# Patient Record
Sex: Female | Born: 1970 | Race: White | Hispanic: No | Marital: Married
Health system: Southern US, Community
[De-identification: ages and names within clinical notes are randomized; demographics above are authoritative.]

---

## 2006-08-27 ENCOUNTER — Ambulatory Visit: Payer: Self-pay | Admitting: Emergency Medicine

## 2007-01-10 ENCOUNTER — Ambulatory Visit: Payer: Self-pay | Admitting: Specialist

## 2007-08-08 ENCOUNTER — Encounter: Payer: Self-pay | Admitting: Maternal & Fetal Medicine

## 2007-09-05 ENCOUNTER — Encounter: Payer: Self-pay | Admitting: Obstetrics and Gynecology

## 2007-11-01 ENCOUNTER — Observation Stay: Payer: Self-pay | Admitting: Obstetrics & Gynecology

## 2007-12-09 ENCOUNTER — Ambulatory Visit: Payer: Self-pay

## 2007-12-10 ENCOUNTER — Observation Stay: Payer: Self-pay | Admitting: Obstetrics and Gynecology

## 2007-12-30 ENCOUNTER — Encounter: Payer: Self-pay | Admitting: Maternal and Fetal Medicine

## 2008-01-07 ENCOUNTER — Observation Stay: Payer: Self-pay

## 2008-02-01 ENCOUNTER — Inpatient Hospital Stay: Payer: Self-pay | Admitting: Obstetrics & Gynecology

## 2008-08-17 ENCOUNTER — Ambulatory Visit: Payer: Self-pay | Admitting: Internal Medicine

## 2009-01-28 IMAGING — US US OB DETAIL+14 WK - NRPT [facility]
1 series · 14 of 28 positions shown · non-contrast
Comparison: none

[Series 1: us ob detail+14 wk - nrpt [facility] · 0.33mm/px · 14 of 58 slices shown]
[im 3/58]
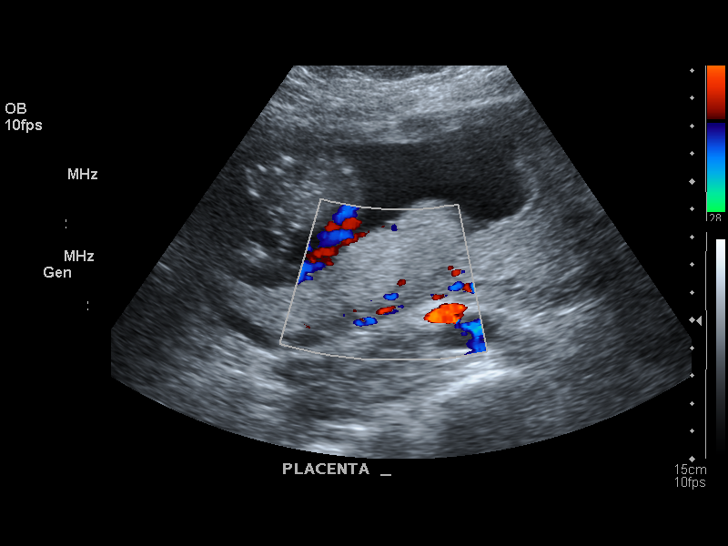
[im 7/58]
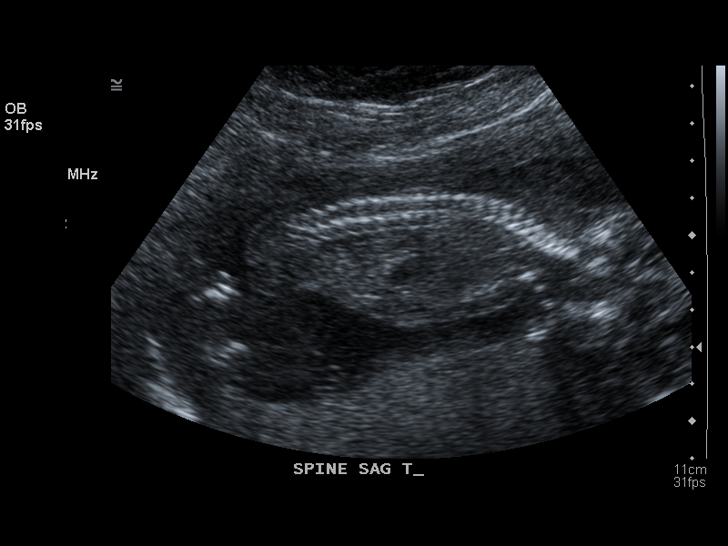
[im 11/58]
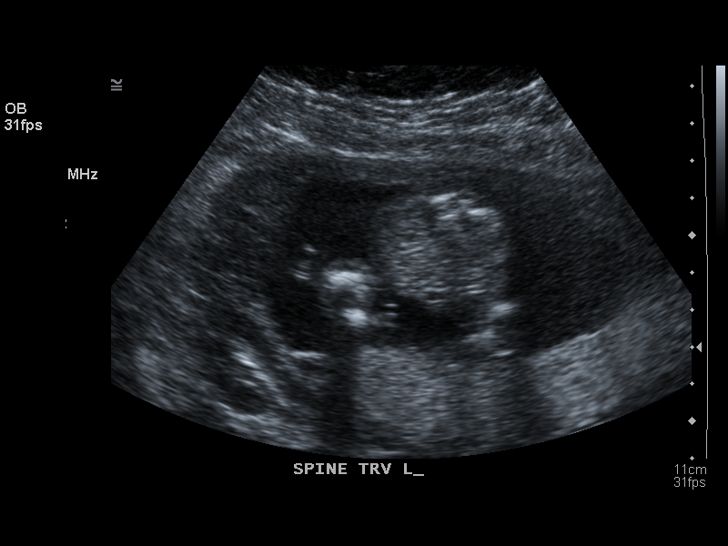
[im 15/58]
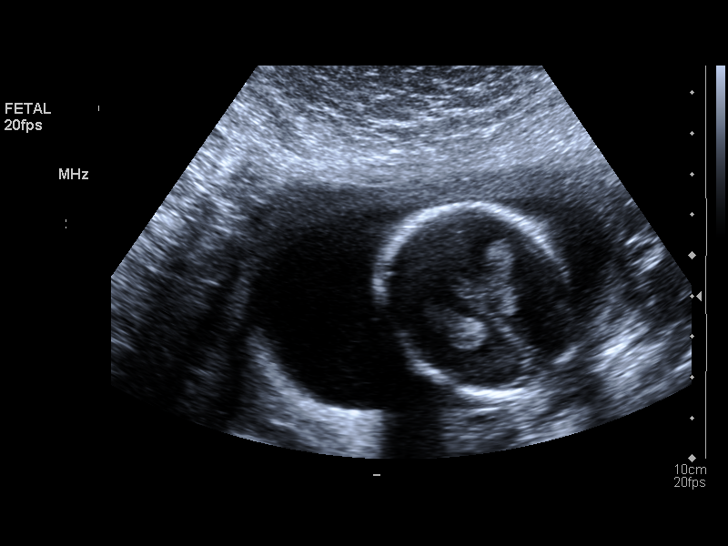
[im 20/58]
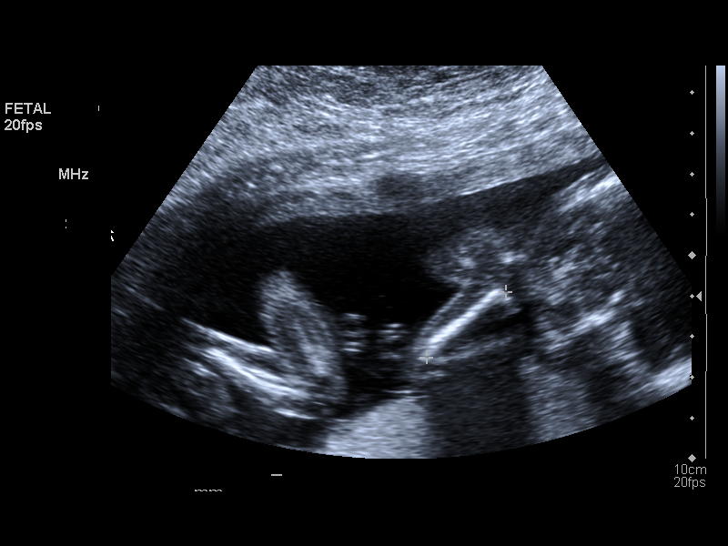
[im 24/58]
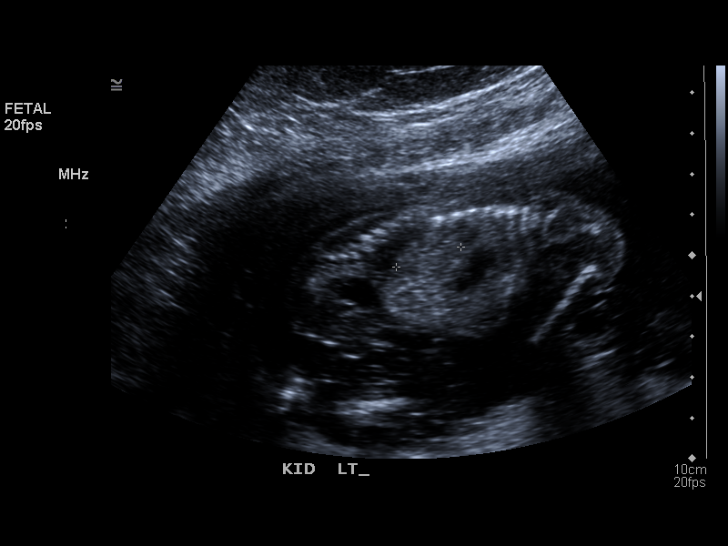
[im 28/58]
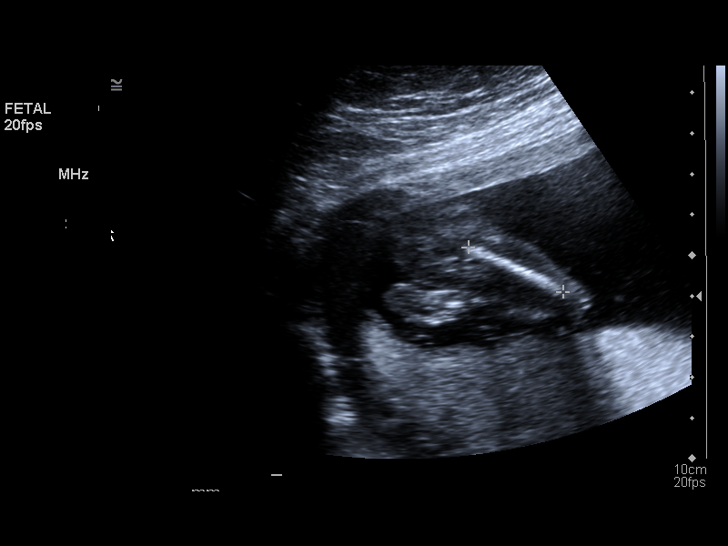
[im 32/58]
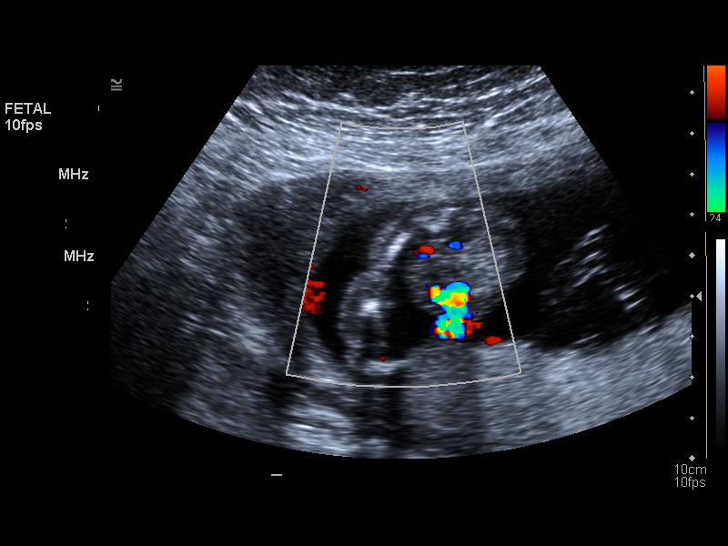
[im 36/58]
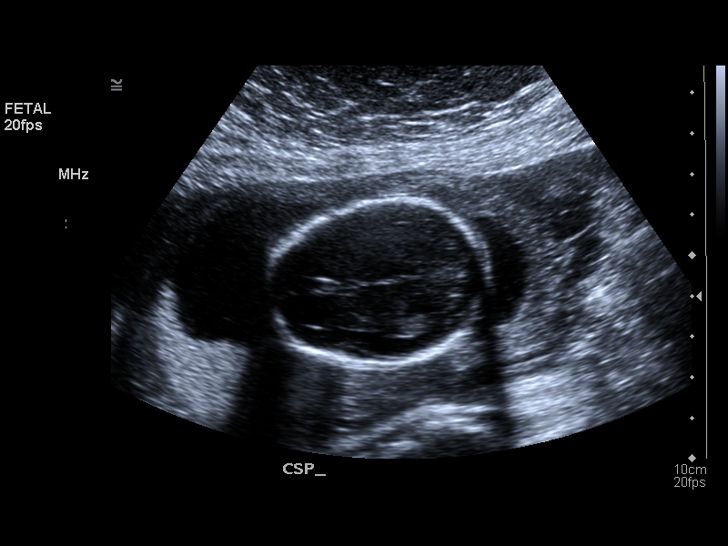
[im 41/58]
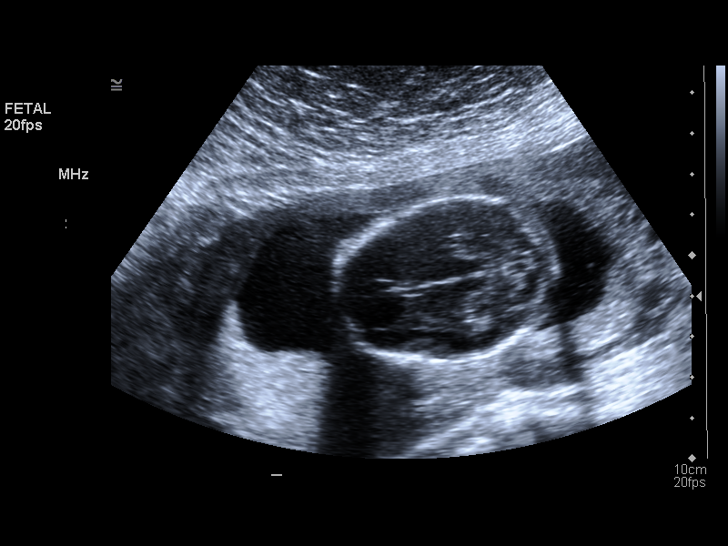
[im 45/58]
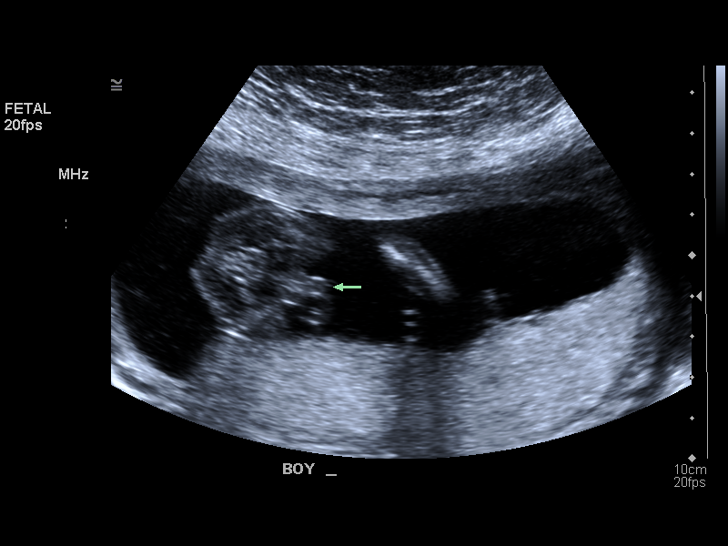
[im 49/58]
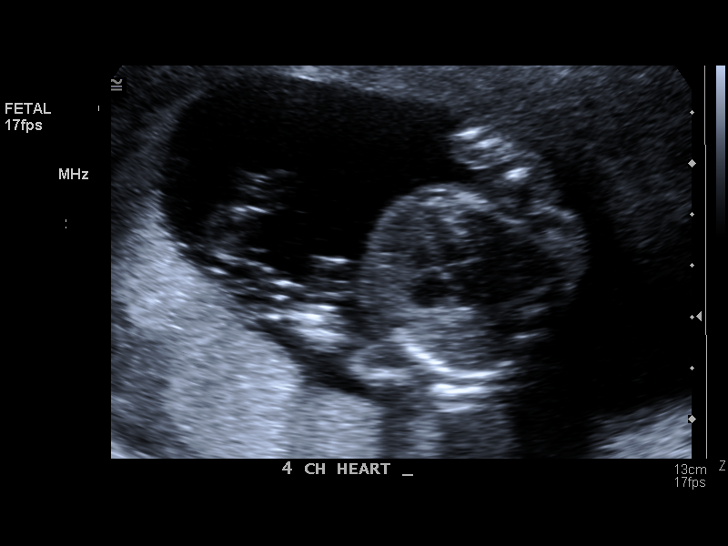
[im 53/58]
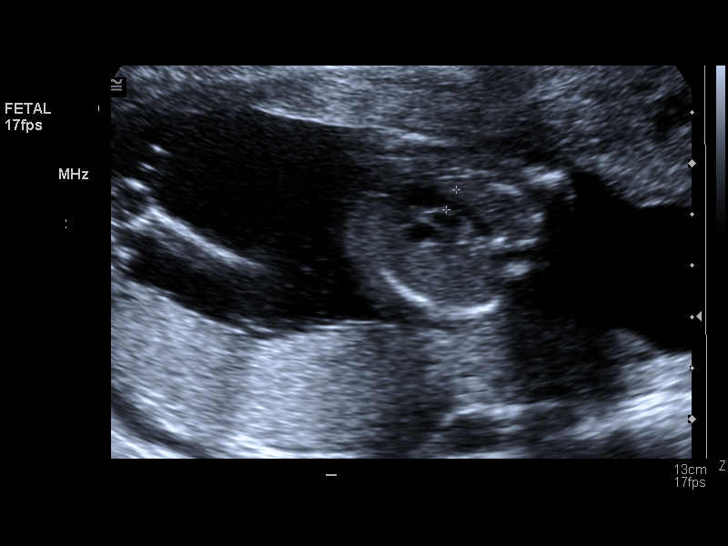
[im 58/58]
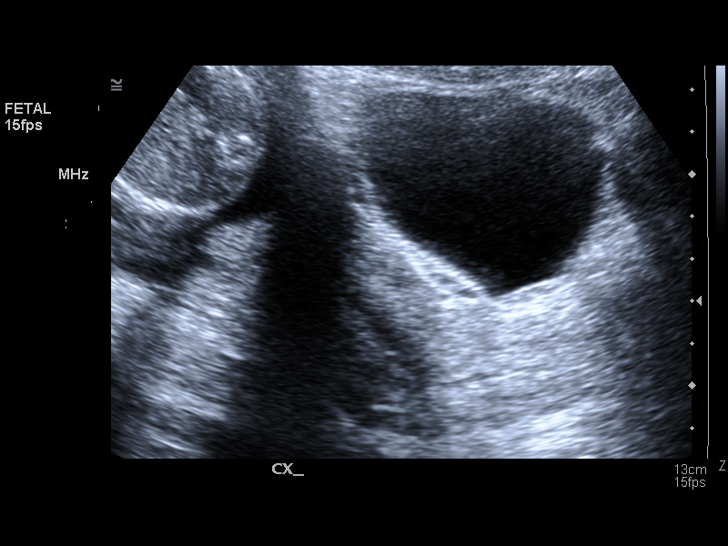

[14 of 28 positions shown; findings below may reference images not displayed]

IMAGES IMPORTED FROM THE [employer] WORKFLOW SYSTEM
NO DICTATION FOR STUDY

## 2009-06-16 ENCOUNTER — Ambulatory Visit: Payer: Self-pay | Admitting: Family Medicine

## 2009-11-08 ENCOUNTER — Inpatient Hospital Stay: Payer: Self-pay | Admitting: Obstetrics and Gynecology

## 2010-01-10 IMAGING — CT CT STONE STUDY
1 of 2 series · 16 of 32 positions shown, 20 images · non-contrast
Comparison: none

REASON FOR EXAM: back pain  eval for kidney stone
COMMENTS:

[Series 4: soft tissue · axial · 0.75mm/px · z∈[-930,-537]mm · 16 of 143 slices shown, 20 images]
[im 6/143  soft-tissue]
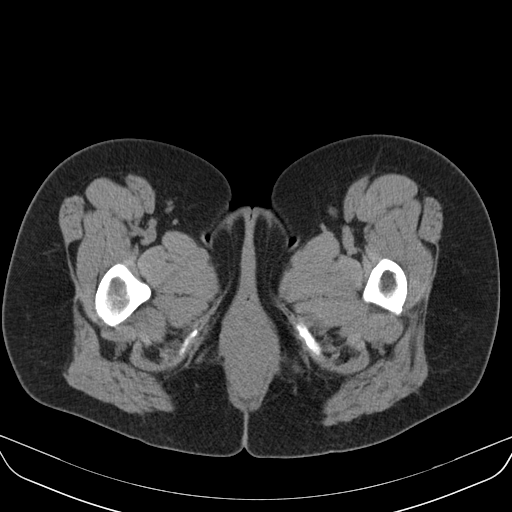
[im 6/143  bone]
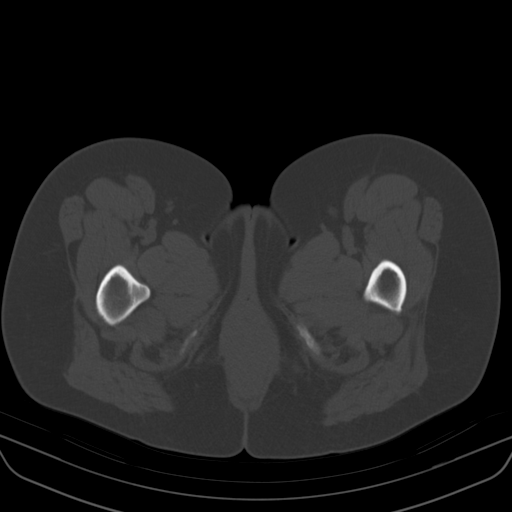
[im 18/143  soft-tissue]
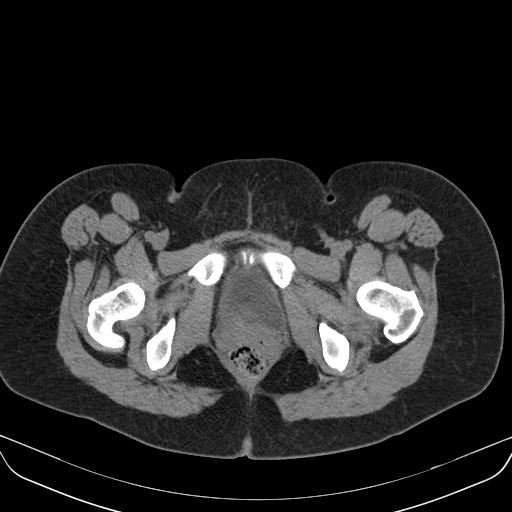
[im 29/143  soft-tissue]
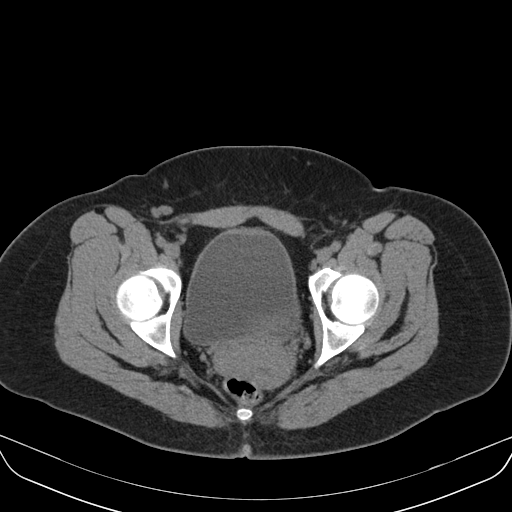
[im 40/143  soft-tissue]
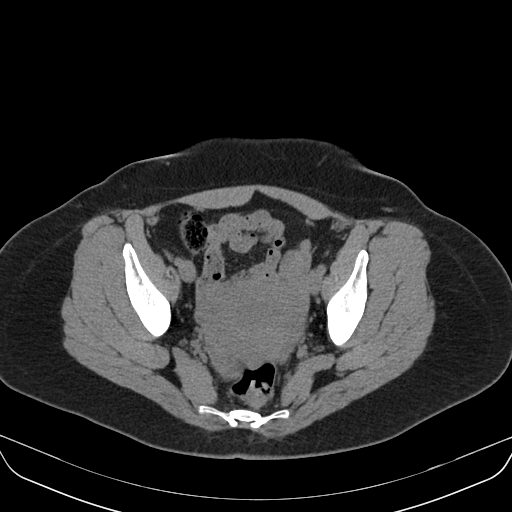
[im 46/143  soft-tissue]
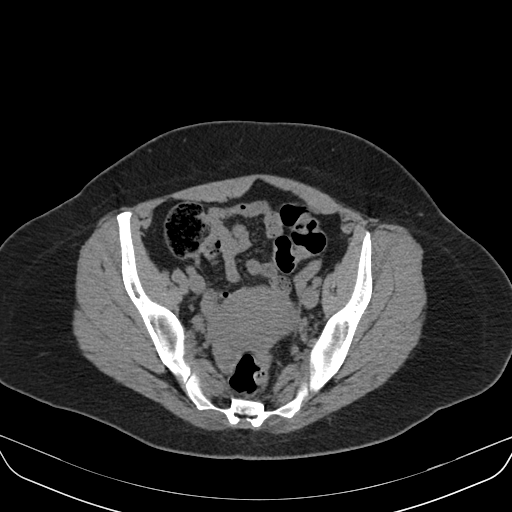
[im 57/143  soft-tissue]
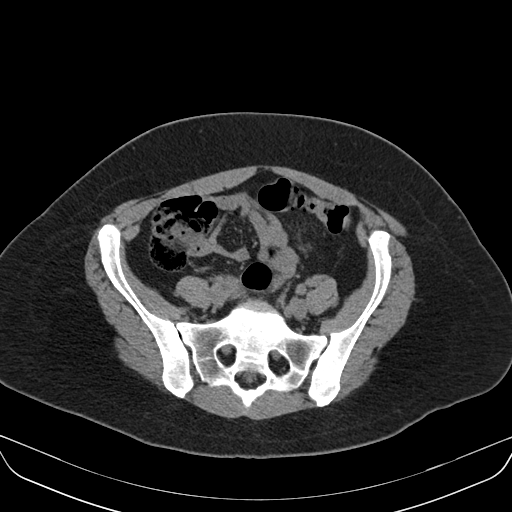
[im 69/143  soft-tissue]
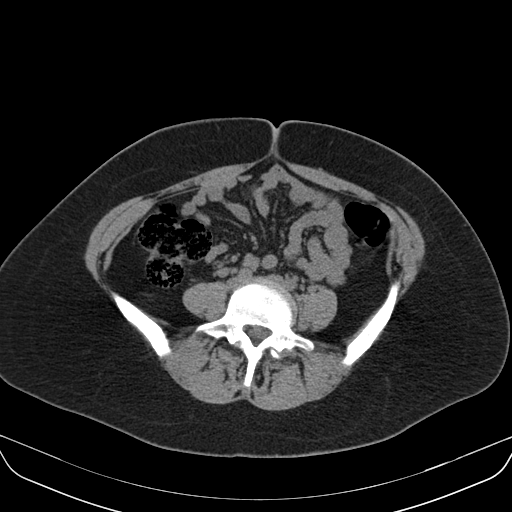
[im 74/143  soft-tissue]
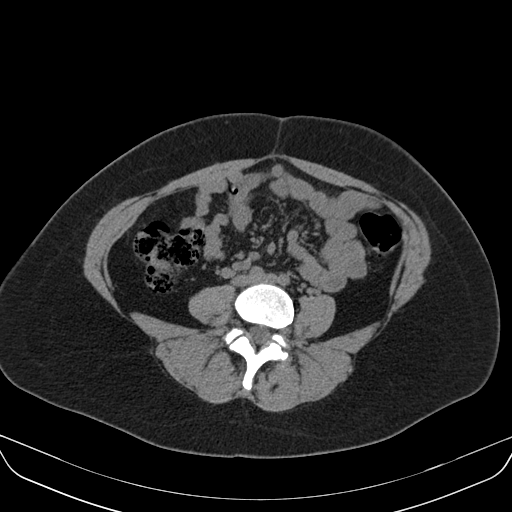
[im 86/143  soft-tissue]
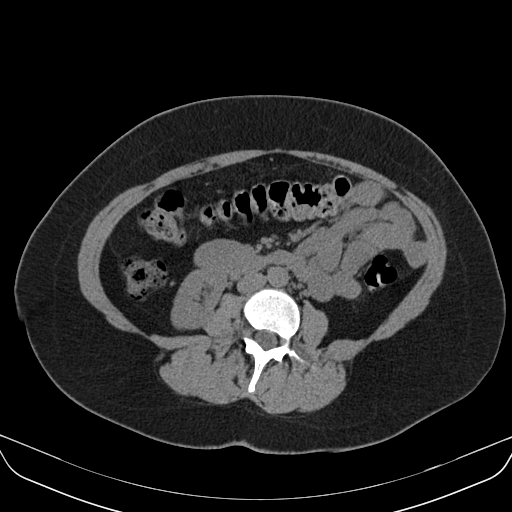
[im 86/143  bone]
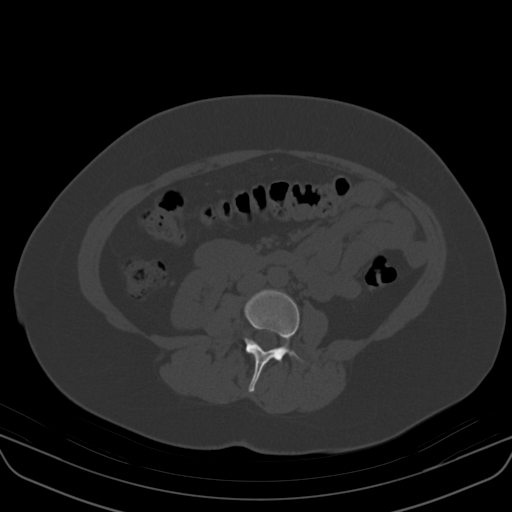
[im 97/143  soft-tissue]
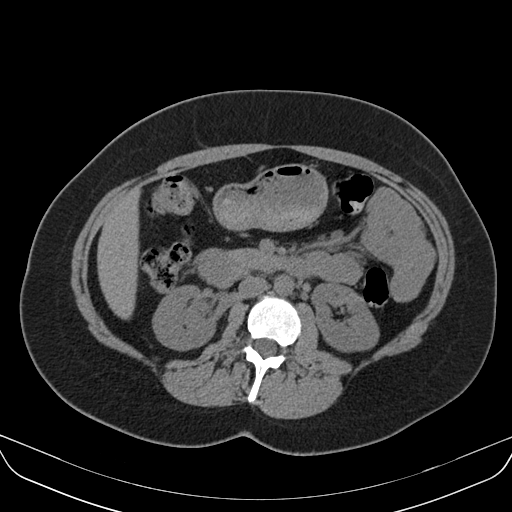
[im 108/143  soft-tissue]
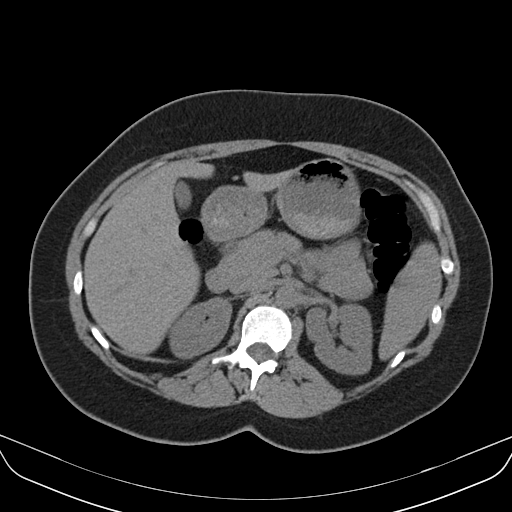
[im 114/143  soft-tissue]
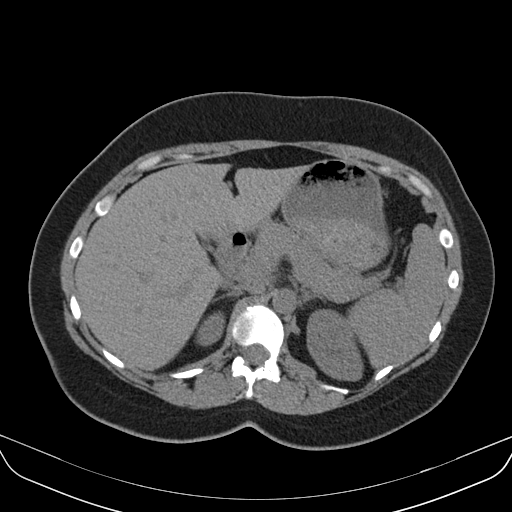
[im 120/143  lung]
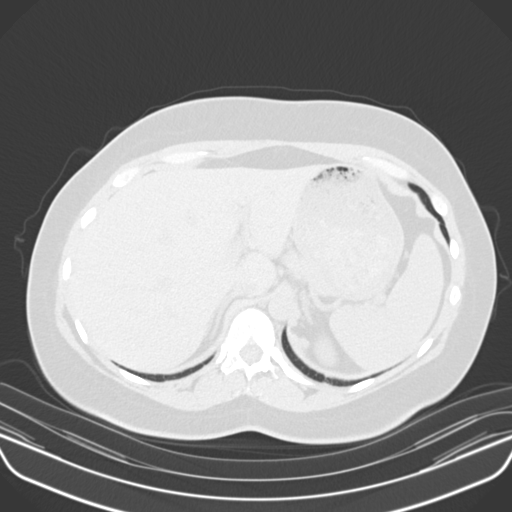
[im 125/143  soft-tissue]
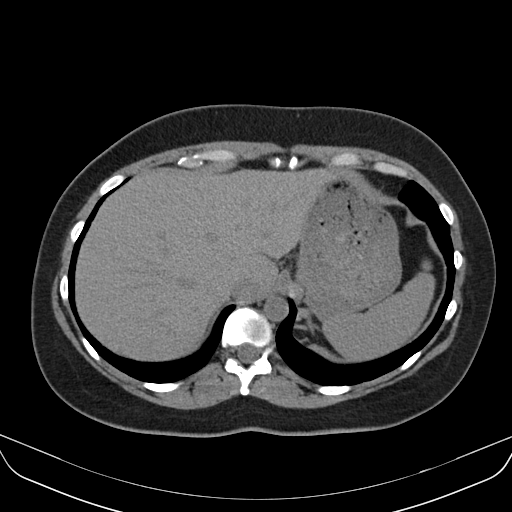
[im 125/143  lung]
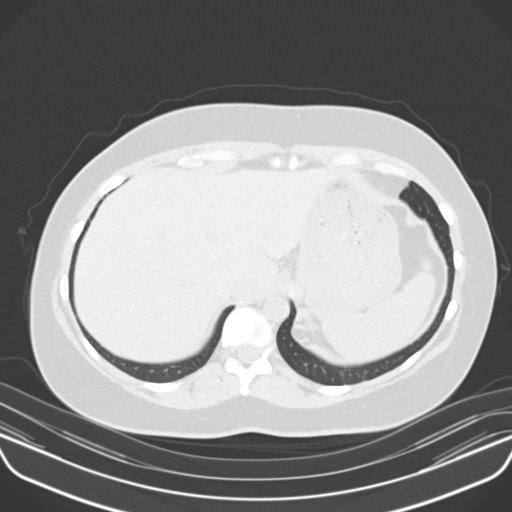
[im 131/143  lung]
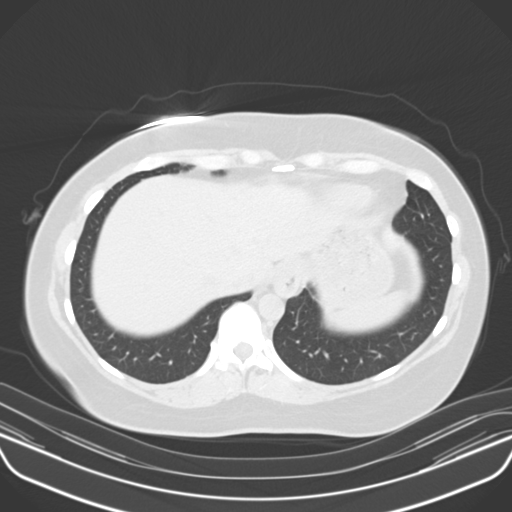
[im 137/143  soft-tissue]
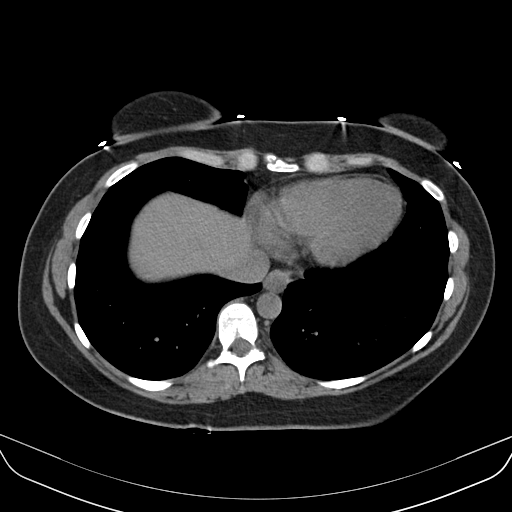
[im 137/143  lung]
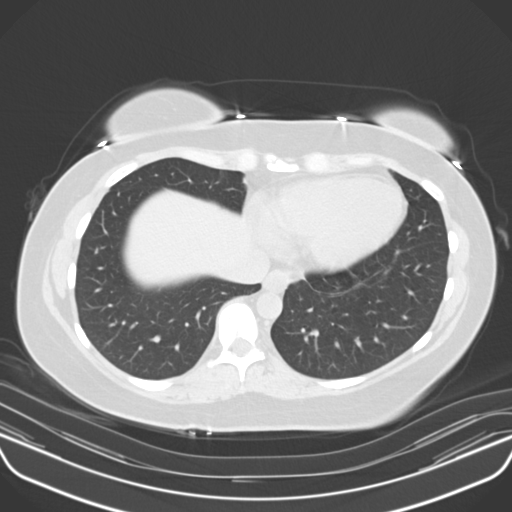

[16 of 32 positions shown; findings below may reference images not displayed]

PROCEDURE:     HANS B - HANS B ABDOMEN/PELVIS WO ( STONE)  - August 17, 2008 [DATE]

RESULT:     History: Back pain.

Comparison Study: Prior CT of 08/27/2006.

Procedure and Findings: Standard nonenhanced CT the abdomen pelvis obtained.
Liver and spleen are normal. Pancreas normal. Adrenals are normal. No focal
renal abnormalities identified .Appendix appears normal. Abdominal aorta is
unremarkable. No free air noted.
IMPRESSION: No acute abnormality. No evidence of obstructing stone.

## 2018-02-12 ENCOUNTER — Ambulatory Visit: Payer: Self-pay | Admitting: Obstetrics and Gynecology

## 2018-02-12 ENCOUNTER — Encounter

## 2018-02-12 NOTE — Progress Notes (Deleted)
   PCP:  Patient, No Pcp Per   No chief complaint on file.    HPI:      Ms. Karen Alvarez is a 48 y.o. No obstetric history on file. who LMP was No LMP recorded., presents today for her annual examination.  Her menses are regular every 28-30 days, lasting 3 days.  Dysmenorrhea {dysmen:716}. She {does:18564} have intermenstrual bleeding.  Sex activity: single partner, contraception - vasectomy.  Last Pap: March 04, 2015  Results were: no abnormalities /neg HPV DNA  Hx of STDs: HPV  Last mammogram: March 04, 2015  Results were: normal--routine follow-up in 12 months Pt is adopted. There is no FH of breast cancer. There is no FH of ovarian cancer. The patient {does:18564} do self-breast exams.  Tobacco use: {tob:20664} Alcohol use: {Alcohol:11675} No drug use.  Exercise: {exercise:31265}  She {does:18564} get adequate calcium and Vitamin D in her diet.   No past medical history on file.  *** The histories are not reviewed yet. Please review them in the "History" navigator section and refresh this SmartLink.  No family history on file.  Social History   Socioeconomic History  . Marital status: Married    Spouse name: Not on file  . Number of children: Not on file  . Years of education: Not on file  . Highest education level: Not on file  Occupational History  . Not on file  Social Needs  . Financial resource strain: Not on file  . Food insecurity:    Worry: Not on file    Inability: Not on file  . Transportation needs:    Medical: Not on file    Non-medical: Not on file  Tobacco Use  . Smoking status: Not on file  Substance and Sexual Activity  . Alcohol use: Not on file  . Drug use: Not on file  . Sexual activity: Not on file  Lifestyle  . Physical activity:    Days per week: Not on file    Minutes per session: Not on file  . Stress: Not on file  Relationships  . Social connections:    Talks on phone: Not on file    Gets together: Not on file   Attends religious service: Not on file    Active member of club or organization: Not on file    Attends meetings of clubs or organizations: Not on file    Relationship status: Not on file  . Intimate partner violence:    Fear of current or ex partner: Not on file    Emotionally abused: Not on file    Physically abused: Not on file    Forced sexual activity: Not on file  Other Topics Concern  . Not on file  Social History Narrative  . Not on file    No outpatient medications prior to visit.   No facility-administered medications prior to visit.       ROS:  Review of Systems BREAST: No symptoms   Objective: There were no vitals taken for this visit.   OBGyn Exam  Results: No results found for this or any previous visit (from the past 24 hour(s)).  Assessment/Plan: No diagnosis found.  No orders of the defined types were placed in this encounter.            GYN counsel {counseling:16159}     F/U  No follow-ups on file.  Alicia B. Copland, PA-C 02/12/2018 2:25 PM

## 2020-07-20 ENCOUNTER — Ambulatory Visit: Payer: 59 | Attending: Family | Admitting: Physical Therapy

## 2020-07-27 ENCOUNTER — Ambulatory Visit: Payer: 59 | Admitting: Physical Therapy

## 2020-08-04 ENCOUNTER — Ambulatory Visit: Payer: 59 | Admitting: Physical Therapy

## 2023-12-06 ENCOUNTER — Ambulatory Visit: Admitting: Internal Medicine
